# Patient Record
Sex: Male | Born: 1980 | Race: White | Hispanic: No | Marital: Married | Smoking: Former smoker
Health system: Southern US, Community
[De-identification: ages and names within clinical notes are randomized; demographics above are authoritative.]

## PROBLEM LIST (undated history)

## (undated) DIAGNOSIS — I10 Essential (primary) hypertension: Secondary | ICD-10-CM

## (undated) DIAGNOSIS — R1310 Dysphagia, unspecified: Secondary | ICD-10-CM

## (undated) DIAGNOSIS — K219 Gastro-esophageal reflux disease without esophagitis: Secondary | ICD-10-CM

## (undated) DIAGNOSIS — G43909 Migraine, unspecified, not intractable, without status migrainosus: Secondary | ICD-10-CM

## (undated) DIAGNOSIS — K224 Dyskinesia of esophagus: Secondary | ICD-10-CM

## (undated) HISTORY — DX: Migraine, unspecified, not intractable, without status migrainosus: G43.909

## (undated) HISTORY — DX: Dysphagia, unspecified: R13.10

## (undated) HISTORY — PX: NO PAST SURGERIES: SHX2092

## (undated) HISTORY — DX: Dyskinesia of esophagus: K22.4

## (undated) HISTORY — PX: WISDOM TOOTH EXTRACTION: SHX21

## (undated) HISTORY — DX: Gastro-esophageal reflux disease without esophagitis: K21.9

---

## 2008-07-29 ENCOUNTER — Emergency Department (HOSPITAL_COMMUNITY): Admission: EM | Admit: 2008-07-29 | Discharge: 2008-07-29 | Payer: Self-pay | Admitting: Emergency Medicine

## 2013-01-09 ENCOUNTER — Emergency Department (HOSPITAL_COMMUNITY): Payer: No Typology Code available for payment source

## 2013-01-09 ENCOUNTER — Encounter (HOSPITAL_COMMUNITY): Payer: Self-pay | Admitting: Emergency Medicine

## 2013-01-09 ENCOUNTER — Emergency Department (HOSPITAL_COMMUNITY)
Admission: EM | Admit: 2013-01-09 | Discharge: 2013-01-09 | Disposition: A | Discharge status: Home / Self Care | Payer: No Typology Code available for payment source | Attending: Emergency Medicine | Admitting: Emergency Medicine

## 2013-01-09 DIAGNOSIS — R079 Chest pain, unspecified: Secondary | ICD-10-CM

## 2013-01-09 DIAGNOSIS — F17200 Nicotine dependence, unspecified, uncomplicated: Secondary | ICD-10-CM | POA: Insufficient documentation

## 2013-01-09 DIAGNOSIS — R0789 Other chest pain: Secondary | ICD-10-CM | POA: Insufficient documentation

## 2013-01-09 DIAGNOSIS — I10 Essential (primary) hypertension: Secondary | ICD-10-CM | POA: Insufficient documentation

## 2013-01-09 DIAGNOSIS — M79609 Pain in unspecified limb: Secondary | ICD-10-CM | POA: Insufficient documentation

## 2013-01-09 DIAGNOSIS — R002 Palpitations: Secondary | ICD-10-CM | POA: Insufficient documentation

## 2013-01-09 HISTORY — DX: Essential (primary) hypertension: I10

## 2013-01-09 LAB — CBC WITH DIFFERENTIAL/PLATELET
Basophils Absolute: 0 10*3/uL (ref 0.0–0.1)
Basophils Relative: 0 % (ref 0–1)
Eosinophils Absolute: 0.2 10*3/uL (ref 0.0–0.7)
Hemoglobin: 16 g/dL (ref 13.0–17.0)
Lymphocytes Relative: 22 % (ref 12–46)
MCHC: 34.6 g/dL (ref 30.0–36.0)
Monocytes Relative: 9 % (ref 3–12)
Neutrophils Relative %: 67 % (ref 43–77)
WBC: 12.3 10*3/uL — ABNORMAL HIGH (ref 4.0–10.5)

## 2013-01-09 LAB — BASIC METABOLIC PANEL
Chloride: 102 mEq/L (ref 96–112)
GFR calc Af Amer: >90 mL/min (ref >90)
GFR calc non Af Amer: >90 mL/min (ref >90)
Glucose, Bld: 102 mg/dL — ABNORMAL HIGH (ref 70–99)
Potassium: 3.7 mEq/L (ref 3.5–5.1)
Sodium: 140 mEq/L (ref 135–145)

## 2013-01-09 LAB — MAGNESIUM: Magnesium: 2.2 mg/dL (ref 1.5–2.5)

## 2013-01-09 LAB — POCT I-STAT TROPONIN I: Troponin i, poc: 0.01 ng/mL (ref 0.00–0.08)

## 2013-01-09 MED ORDER — ASPIRIN 81 MG PO CHEW
324.0000 mg | CHEWABLE_TABLET | Freq: Once | ORAL | Status: AC
  Administered 2013-01-09: 324 mg via ORAL
  Filled 2013-01-09: qty 4

## 2013-01-09 MED ORDER — SODIUM CHLORIDE 0.9 % IV BOLUS (SEPSIS)
1000.0000 mL | Freq: Once | INTRAVENOUS | Status: AC
  Administered 2013-01-09: 1000 mL via INTRAVENOUS

## 2013-01-09 MED ORDER — MORPHINE SULFATE 4 MG/ML IJ SOLN
4.0000 mg | Freq: Once | INTRAMUSCULAR | Status: AC
  Administered 2013-01-09: 4 mg via INTRAVENOUS
  Filled 2013-01-09: qty 1

## 2013-01-09 MED ORDER — ONDANSETRON HCL 4 MG/2ML IJ SOLN
4.0000 mg | Freq: Once | INTRAMUSCULAR | Status: AC
  Administered 2013-01-09: 4 mg via INTRAVENOUS
  Filled 2013-01-09: qty 2

## 2013-01-09 NOTE — ED Provider Notes (Signed)
CSN: [id number]     Arrival date & time 01/09/13  1504 History   First MD Initiated Contact with Patient 01/09/13 1512     Chief Complaint  Patient presents with  . Tachycardia   (Consider location/radiation/quality/duration/timing/severity/associated sxs/prior Treatment) HPI Pt here with chest pain. He has never had pain like this before. Onset was this morning upon waking.  The pain is moderate in intensity. Described as heaviness and pressure with radiation to left arm. Modifying factors: worse with deep breaths.  Associated symptoms: palpitations, mild SOB, lightheadedness, no fever, no emesis.  Recent medical care: none.   Past Medical History  Diagnosis Date  . Hypertension    History reviewed. No pertinent past surgical history. History reviewed. No pertinent family history. History  Substance Use Topics  . Smoking status: Current Every Day Smoker    Types: Cigarettes  . Smokeless tobacco: Not on file  . Alcohol Use: No    Review of Systems Constitutional: Negative for fever.  Eyes: Negative for vision loss.  ENT: Negative for difficulty swallowing.  Cardiovascular: Positive for chest pain. Respiratory: Negative for respiratory distress.  Gastrointestinal:  Negative for vomiting.  Genitourinary: Negative for inability to void.  Musculoskeletal: Negative for gait problem.  Integumentary: Negative for rash.  Neurological: Negative for new focal weakness.     Allergies  Review of patient's allergies indicates no known allergies.  Home Medications  No current outpatient prescriptions on file. BP 121/95  Pulse 90  Temp(Src) 98.6 F (37 C) (Oral)  Resp 18  SpO2 98% Physical Exam Nursing note and vitals reviewed.  Constitutional: Pt is alert and appears stated age. Eyes: No injection, no scleral icterus. HENT: Atraumatic, airway open without erythema or exudate.  Respiratory: No respiratory distress. Equal breathing bilaterally. Cardiovascular: Tachycardic  rate, regular rhythm. Extremities warm and well perfused.  Abdomen: Soft, non-tender. MSK: Extremities are atraumatic without deformity. Skin: No rash, no wounds.   Neuro: No motor nor sensory deficit. GCS 15.      ED Course  Procedures (including critical care time) Labs Review Labs Reviewed  CBC WITH DIFFERENTIAL - Abnormal; Notable for the following:    WBC 12.3 (*)    Neutro Abs 8.3 (*)    Monocytes Absolute 1.1 (*)    All other components within normal limits  BASIC METABOLIC PANEL - Abnormal; Notable for the following:    Glucose, Bld 102 (*)    All other components within normal limits  D-DIMER, QUANTITATIVE  MAGNESIUM  TROPONIN I  POCT I-STAT TROPONIN I   Imaging Review Dg Chest Portable 1 View  01/09/2013   CLINICAL DATA:  Tachycardia.  EXAM: PORTABLE CHEST - 1 VIEW  COMPARISON:  None.  FINDINGS: The heart size and mediastinal contours are within normal limits. Both lungs are clear. The visualized skeletal structures are unremarkable.  IMPRESSION: No active disease.   Electronically Signed   By: Amie Portland M.D.   On: 01/09/2013 15:42    EKG Interpretation   None       MDM   1. Chest pain   2. Palpitations    32 y.o. male w/ PMHx of tobacco use presents w/ chest pain, tachycardia. Nursing reports initial monitor HR 160-170. Initial EKG with rate 155, sinus. On my eval pt 120 sinus tachycardia on monitor. Normal mental status, no hypotension. Atypical story for ACS. Pt given ASA, troponin neg x2. Plan to f/u with cardiology. Low risk PE, d-dimer neg. Pt given IVF bolus, IV morphine, IV zofran with  resolution of tachycardia. Electrolytes unremarkable. CXR without infection. Plan for cardiology follow up. Counseling provided regarding diagnosis, treatment plan, follow up recommendations, and return precautions. Questions answered.       I independently viewed, interpreted, and used in my medical decision making all ordered lab and imaging tests. Medical  Decision Making discussed with ED attending Dr. Jodi Mourning.     Charm Barges, MD 01/09/13 2221

## 2013-01-09 NOTE — ED Notes (Signed)
Presents with chest heaviness and pressure that began this am worse when taking a deep breath pain described as heaviness and dull with radiation to left arm assocaited with SOB and dizziness. Monitor shows SVT HR 160-170. Pt is alert and oriented. BP 167/80. +smoker, no cardiac history.

## 2013-01-10 NOTE — ED Provider Notes (Signed)
Medical screening examination/treatment/procedure(s) were conducted as a shared visit with non-physician practitioner(s) or resident  and myself.  I personally evaluated the patient during the encounter and agree with the findings and plan unless otherwise indicated.    I have personally reviewed any xrays and/ or EKG's with the provider and I agree with interpretation.   EKG Interpretation    Date/Time:  Monday January 09 2013 15:18:57 EST Ventricular Rate:  109 PR Interval:  147 QRS Duration: 102 QT Interval:  357 QTC Calculation: 481 R Axis:   44 Text Interpretation:  Sinus tachycardia Borderline prolonged QT interval No acute findings           Constant chest pressure since this am with palpitations, no syncope, mild radiation left arm.  No cardiac or lung hx.  Patient denies blood clot history, active cancer, recent major trauma or surgery, unilateral leg swelling/ pain, recent long travel, hemoptysis.  Tachy 170s initially, pt did bear down, possibly SVT.  No delta wave or short PR on EKG.   CP resolved.  Troponins neg, pt low risk PE and low risk CAD, close fup outpt discussed, comfortable with plan. Filed Vitals:   01/09/13 1601 01/09/13 1615 01/09/13 1700 01/09/13 1800  BP: 112/74 124/69 123/72 121/95  Pulse: 102 88 88 90  Temp:      TempSrc:      Resp:  14 15 18   SpO2: 100% 93% 94% 98%   Chest pain, Sinus tachycardia  Enid Skeens, MD 01/10/13 [phone]

## 2013-01-17 ENCOUNTER — Encounter: Payer: Self-pay | Admitting: Cardiology

## 2013-01-17 ENCOUNTER — Ambulatory Visit (INDEPENDENT_AMBULATORY_CARE_PROVIDER_SITE_OTHER): Payer: No Typology Code available for payment source | Admitting: Cardiology

## 2013-01-17 VITALS — BP 131/92 | HR 70 | Ht 68.0 in | Wt 214.0 lb

## 2013-01-17 DIAGNOSIS — R079 Chest pain, unspecified: Secondary | ICD-10-CM | POA: Insufficient documentation

## 2013-01-17 DIAGNOSIS — R002 Palpitations: Secondary | ICD-10-CM

## 2013-01-17 NOTE — Progress Notes (Signed)
Patient ID: Zaion Hreha, male   DOB: [birth year], 32 y.o.   MRN: [id number]     Patient Name: Camille Thau Date of Encounter: 01/17/2013  Primary Care Provider:  No PCP Per Patient Primary Cardiologist:  Tobias Alexander, H  Problem List   Past Medical History  Diagnosis Date  . Hypertension    No past surgical history on file.  Allergies  No Known Allergies  HPI  32 year old male with h/o hypertension who presented to the ER on 01/09/13 after he woke up with chest pain. The patient described his pain as pressure like associated with SOB. It lasted for several hours and resolved in the ER. The pain is moderate in intensity. Described as heaviness and pressure with radiation to left arm. Modifying factors: worse with deep breaths. Associated symptoms: palpitations, mild SOB, lightheadedness, no fever, no emesis.  He is fairly active, and doesn't feel limited in his activities by any symtoms. No more episodes since than. The patient lives quite stressful life, working, studying and taking care of 2 small kids, but didn't feel stressed that day. He states that he has h/o panic attacks but this felt different.  Home Medications  Prior to Admission medications   Not on File    Family History  No family history on file.  Social History  History   Social History  . Marital Status: Married    Spouse Name: N/A    Number of Children: N/A  . Years of Education: N/A   Occupational History  . Not on file.   Social History Main Topics  . Smoking status: Current Every Day Smoker    Types: Cigarettes  . Smokeless tobacco: Not on file  . Alcohol Use: No  . Drug Use: No  . Sexual Activity: Not on file   Other Topics Concern  . Not on file   Social History Narrative  . No narrative on file     Review of Systems, as per HPI, otherwise negative General:  No chills, fever, night sweats or weight changes.  Cardiovascular:  No chest pain, dyspnea on exertion, edema,  orthopnea, palpitations, paroxysmal nocturnal dyspnea. Dermatological: No rash, lesions/masses Respiratory: No cough, dyspnea Urologic: No hematuria, dysuria Abdominal:   No nausea, vomiting, diarrhea, bright red blood per rectum, melena, or hematemesis Neurologic:  No visual changes, wkns, changes in mental status. All other systems reviewed and are otherwise negative except as noted above.  Physical Exam  Blood pressure 131/92, pulse 70, height 5\' 8"  (1.727 m), weight 214 lb (97.07 kg).  General: Pleasant, NAD Psych: Normal affect. Neuro: Alert and oriented X 3. Moves all extremities spontaneously. HEENT: Normal  Neck: Supple without bruits or JVD. Lungs:  Resp regular and unlabored, CTA. Heart: RRR no s3, s4, or murmurs. Abdomen: Soft, non-tender, non-distended, BS + x 4.  Extremities: No clubbing, cyanosis or edema. DP/PT/Radials 2+ and equal bilaterally.  Labs:  No results found for this basename: CKTOTAL, CKMB, TROPONINI,  in the last 72 hours Lab Results  Component Value Date   WBC 12.3* 01/09/2013   HGB 16.0 01/09/2013   HCT 46.2 01/09/2013   MCV 85.6 01/09/2013   PLT 262 01/09/2013   No results found for this basename: NA, K, CL, CO2, BUN, CREATININE, CALCIUM, LABALBU, PROT, BILITOT, ALKPHOS, ALT, AST, GLUCOSE,  in the last 168 hours No results found for this basename: CHOL, HDL, LDLCALC, TRIG   Lab Results  Component Value Date   DDIMER <0.27 01/09/2013   No  components found with this basename: POCBNP,   Accessory Clinical Findings  echocardiogram  ECG - sinus rhythm, 64 beats per minute, normal ECG   Assessment & Plan  32 year old male with no prior medical history who presented to the ER with atypical chest pain.   We will preform exercise treadmill stress test and order CMP, CBC and TSH. The pretest probability is low, but some of the symptoms such as SOB and radiation to the arm is concerning.   Follow up in 1 month.  Lars Masson, MD,  [facility] 01/17/2013, 12:22 PM

## 2013-01-17 NOTE — Patient Instructions (Signed)
Your physician has requested that you have an exercise tolerance test. For further information please visit [email or web address] Please also follow instruction sheet, as given.  Your physician recommends that you return for FASTING lab work. (lipid,TSH,CMP)  Your physician recommends that you schedule a follow-up appointment after exercise test

## 2013-01-23 ENCOUNTER — Other Ambulatory Visit: Payer: No Typology Code available for payment source

## 2013-02-24 ENCOUNTER — Encounter: Payer: No Typology Code available for payment source | Admitting: Physician Assistant

## 2013-02-28 ENCOUNTER — Encounter: Payer: Self-pay | Admitting: Physician Assistant

## 2014-05-02 ENCOUNTER — Ambulatory Visit: Payer: No Typology Code available for payment source | Admitting: Family

## 2015-10-22 ENCOUNTER — Encounter (HOSPITAL_COMMUNITY): Payer: Self-pay | Admitting: Emergency Medicine

## 2015-10-22 ENCOUNTER — Emergency Department (HOSPITAL_COMMUNITY): Payer: No Typology Code available for payment source

## 2015-10-22 ENCOUNTER — Emergency Department (HOSPITAL_COMMUNITY)
Admission: EM | Admit: 2015-10-22 | Discharge: 2015-10-22 | Disposition: A | Discharge status: Home / Self Care | Payer: No Typology Code available for payment source | Attending: Emergency Medicine | Admitting: Emergency Medicine

## 2015-10-22 DIAGNOSIS — R51 Headache: Secondary | ICD-10-CM | POA: Diagnosis present

## 2015-10-22 DIAGNOSIS — F17210 Nicotine dependence, cigarettes, uncomplicated: Secondary | ICD-10-CM | POA: Diagnosis not present

## 2015-10-22 DIAGNOSIS — R791 Abnormal coagulation profile: Secondary | ICD-10-CM | POA: Insufficient documentation

## 2015-10-22 DIAGNOSIS — R519 Headache, unspecified: Secondary | ICD-10-CM

## 2015-10-22 DIAGNOSIS — I10 Essential (primary) hypertension: Secondary | ICD-10-CM | POA: Insufficient documentation

## 2015-10-22 LAB — COMPREHENSIVE METABOLIC PANEL WITH GFR
ALT: 39 U/L (ref 17–63)
AST: 35 U/L (ref 15–41)
Albumin: 4.4 g/dL (ref 3.5–5.0)
Alkaline Phosphatase: 88 U/L (ref 38–126)
Anion gap: 10 (ref 5–15)
BUN: 8 mg/dL (ref 6–20)
CO2: 24 mmol/L (ref 22–32)
Calcium: 9.5 mg/dL (ref 8.9–10.3)
Chloride: 104 mmol/L (ref 101–111)
Creatinine, Ser: 1.2 mg/dL (ref 0.61–1.24)
GFR calc Af Amer: >60 mL/min
GFR calc non Af Amer: >60 mL/min
Glucose, Bld: 98 mg/dL (ref 65–99)
Potassium: 3.4 mmol/L — ABNORMAL LOW (ref 3.5–5.1)
Sodium: 138 mmol/L (ref 135–145)
Total Bilirubin: 0.4 mg/dL (ref 0.3–1.2)
Total Protein: 7.4 g/dL (ref 6.5–8.1)

## 2015-10-22 LAB — CBC
HCT: 46.8 % (ref 39.0–52.0)
Hemoglobin: 15.4 g/dL (ref 13.0–17.0)
MCH: 28.2 pg (ref 26.0–34.0)
MCHC: 32.9 g/dL (ref 30.0–36.0)
MCV: 85.6 fL (ref 78.0–100.0)
Platelets: 233 K/uL (ref 150–400)
RBC: 5.47 MIL/uL (ref 4.22–5.81)
RDW: 12.6 % (ref 11.5–15.5)
WBC: 9.3 K/uL (ref 4.0–10.5)

## 2015-10-22 LAB — PROTIME-INR
INR: 0.91
Prothrombin Time: 12.3 s (ref 11.4–15.2)

## 2015-10-22 LAB — I-STAT CHEM 8, ED
BUN: 11 mg/dL (ref 6–20)
CREATININE: 1.1 mg/dL (ref 0.61–1.24)
Calcium, Ion: 1.17 mmol/L (ref 1.15–1.40)
Chloride: 101 mmol/L (ref 101–111)
Glucose, Bld: 94 mg/dL (ref 65–99)
HEMATOCRIT: 50 % (ref 39.0–52.0)
Hemoglobin: 17 g/dL (ref 13.0–17.0)
POTASSIUM: 3.3 mmol/L — AB (ref 3.5–5.1)
Sodium: 141 mmol/L (ref 135–145)
TCO2: 24 mmol/L (ref 0–100)

## 2015-10-22 LAB — DIFFERENTIAL
Basophils Absolute: 0 K/uL (ref 0.0–0.1)
Basophils Relative: 0 %
Eosinophils Absolute: 0.2 K/uL (ref 0.0–0.7)
Eosinophils Relative: 2 %
Lymphocytes Relative: 31 %
Lymphs Abs: 2.9 K/uL (ref 0.7–4.0)
Monocytes Absolute: 0.6 K/uL (ref 0.1–1.0)
Monocytes Relative: 7 %
Neutro Abs: 5.5 K/uL (ref 1.7–7.7)
Neutrophils Relative %: 60 %

## 2015-10-22 LAB — APTT: aPTT: 29 s (ref 24–36)

## 2015-10-22 LAB — I-STAT TROPONIN, ED: Troponin i, poc: 0 ng/mL (ref 0.00–0.08)

## 2015-10-22 NOTE — ED Triage Notes (Addendum)
Pt from home with c/o left sided sharp headache starting last night while he was in the grocery store.  Reports that he was able to read words but unable to get the words out or communicate at that time.  He additionally reports severe tingling/numbness in his right hand.  Pt states he's had migraines in the past but the pain was worse than he's ever had.  Pt states he went home, took medication and went to sleep.  Pt reports eadache is now dull, numbness resolved, tingling still present, and able to communicate at his baseline.  Pt in NAD, A&O.

## 2015-10-22 NOTE — ED Notes (Signed)
Patient has returned from being out of the department; placed patient on monitor, continuous pulse oximetry and blood pressure cuff; visitor at bedside

## 2015-10-22 NOTE — Discharge Instructions (Signed)
As we discussed your lab work, CT, and MRI were normal. Recommend that you follow-up with neurology-- call their office to make appointment. Return here for any new or worsening symptoms including severe headache, dizziness, confusion, numbness, or weakness.

## 2015-10-22 NOTE — ED Notes (Signed)
Patient being transported out of the department at this time for testing by Radiology transport tech

## 2015-10-22 NOTE — ED Notes (Signed)
Headache that started last night about 600pm.  Patient states he had trouble speaking with same, but resolved last night.   Patient states he had that previously, but "It was a complicated migraine".   No Code Stroke called at this time.

## 2015-10-22 NOTE — ED Provider Notes (Signed)
[facility]-EMERGENCY DEPT Provider Note   CSN: [id number] Arrival date & time: 10/22/15  4098     History   Chief Complaint No chief complaint on file.   HPI Stephen Ayala is a 35 y.o. male.  The history is provided by the patient and medical records.    35 year old male with history of hypertension, not currently on medication, presenting to the ED for headache. Patient states this began yesterday while he was at the grocery store with his children.  States it was intense, sharp pain behind his left eye that gradually got worse. He states he completed his shopping as normal.  He was looking at a magazine in the checkout line and noticed that when he was reading the words he was transposing them and was having some trouble recognizing pictures on the pages. He states he went to read out loud and noticed that he could not "get his words out". No blurred vision, visual loss, focal eye pain, photophobia, nausea, emesis, tinnitus, confusion, or gait disturbance.  He states this improved but then he began having some numbness of his right hand. He states he was able to check out at the store and drive home. He states he took some Tylenol without relief so he then took a Goody powder and was able to go to sleep is normal. States upon waking this morning he felt pretty much back to baseline. He states he has been able to talk fluently today. States numbness in right hand has resolved.  States he does have history of migraines and has had similar episodes in the past. He states the only reason he is here is because he called his mother and she was very concerned and encouraged him to be seen. He has no known history of TIA or stroke. States his grandmother has had strokes in the past. He is not currently on any type of anticoagulation.  He is not currently established with a neurologist. Patient does admit to some stress recently as he and his wife just had their third daughter. He states their home has been  "chaotic" lately. States he does have a mild headache at present, rated 2/10 localized to his right occipital region.  No fever or chills. No neck pain.  Past Medical History:  Diagnosis Date  . Hypertension     Patient Active Problem List   Diagnosis Date Noted  . Chest pain at rest 01/17/2013    No past surgical history on file.     Home Medications    Prior to Admission medications   Not on File    Family History No family history on file.  Social History Social History  Substance Use Topics  . Smoking status: Current Every Day Smoker    Types: Cigarettes  . Smokeless tobacco: Not on file  . Alcohol use No     Allergies   Review of patient's allergies indicates no known allergies.   Review of Systems Review of Systems  Neurological: Positive for headaches.  All other systems reviewed and are negative.    Physical Exam Updated Vital Signs BP 156/97   Pulse (!) 128   SpO2 98%   Physical Exam  Constitutional: He is oriented to person, place, and time. He appears well-developed and well-nourished. No distress.  Sitting up in bed, able to text on cell phone, NAD  HENT:  Head: Normocephalic and atraumatic.  Right Ear: External ear normal.  Left Ear: External ear normal.  Mouth/Throat: Oropharynx is clear and  moist.  Eyes: Conjunctivae and EOM are normal. Pupils are equal, round, and reactive to light.  Pupils symmetric and reactive bilaterally, EOMs fully intact, no nystagmus, no field cuts, normal confrontation  Neck: Normal range of motion and full passive range of motion without pain. Neck supple. No neck rigidity.  No rigidity, no meningismus  Cardiovascular: Normal rate, regular rhythm and normal heart sounds.   No murmur heard. Pulmonary/Chest: Effort normal and breath sounds normal. No respiratory distress. He has no wheezes. He has no rhonchi.  Abdominal: Soft. Bowel sounds are normal. There is no tenderness. There is no guarding.    Musculoskeletal: Normal range of motion. He exhibits no edema.  Neurological: He is alert and oriented to person, place, and time. He has normal strength. He displays no tremor. No cranial nerve deficit or sensory deficit. He displays no seizure activity.  AAOx3, answering questions and following commands appropriately; equal strength UE and LE bilaterally; CN grossly intact; moves all extremities appropriately without ataxia; no dysmetria with finger to nose; normal sensation to sharp and dull touch in all 4 extremities as well as face and trunk; normal, fluid speech without noted expressive aphasia or slurring; normal gait  Skin: Skin is warm and dry. No rash noted. He is not diaphoretic.  Psychiatric: He has a normal mood and affect. His behavior is normal. Thought content normal.  Nursing note and vitals reviewed.    ED Treatments / Results  Labs (all labs ordered are listed, but only abnormal results are displayed) Labs Reviewed  COMPREHENSIVE METABOLIC PANEL - Abnormal; Notable for the following:       Result Value   Potassium 3.4 (*)    All other components within normal limits  I-STAT CHEM 8, ED - Abnormal; Notable for the following:    Potassium 3.3 (*)    All other components within normal limits  PROTIME-INR  APTT  CBC  DIFFERENTIAL  I-STAT TROPOININ, ED    EKG  EKG Interpretation None       Radiology Ct Head Wo Contrast  Result Date: 10/22/2015 CLINICAL DATA:  Right arm numbness, headache. EXAM: CT HEAD WITHOUT CONTRAST TECHNIQUE: Contiguous axial images were obtained from the base of the skull through the vertex without intravenous contrast. COMPARISON:  None. FINDINGS: Brain: No evidence of acute infarction, hemorrhage, hydrocephalus, extra-axial collection or mass lesion/mass effect. Vascular: No hyperdense vessel or unexpected calcification. Skull: No osseous abnormality. Sinuses/Orbits: Visualized paranasal sinuses are clear. Visualized mastoid sinuses are  clear. Visualized orbits demonstrate no focal abnormality. Other: None IMPRESSION: No acute intracranial pathology. Electronically Signed   By: Elige Ko   On: 10/22/2015 10:51   Mr Brain Wo Contrast  Result Date: 10/22/2015 CLINICAL DATA:  Headache. Expressive aphasia and right hand numbness -resolved EXAM: MRI HEAD WITHOUT CONTRAST TECHNIQUE: Multiplanar, multiecho pulse sequences of the brain and surrounding structures were obtained without intravenous contrast. COMPARISON:  CT head 10/22/2015 FINDINGS: Ventricle size normal.  Cerebral volume normal. Negative for acute or chronic infarction Negative for demyelinating disease. Cerebral white matter normal. Basal ganglia and brainstem and cerebellum normal. Negative for intracranial hemorrhage. Negative for mass or edema. No shift of the midline structures Circle of Willis patent. Left vertebral artery appears to end in PICA Minimal mucosal edema paranasal sinuses. Normal orbital structures. Pituitary normal in size. IMPRESSION: Normal unenhanced MRI of the brain. Electronically Signed   By: Marlan Palau M.D.   On: 10/22/2015 13:44    Procedures Procedures (including critical care time)  Medications  Ordered in ED Medications - No data to display   Initial Impression / Assessment and Plan / ED Course  I have reviewed the triage vital signs and the nursing notes.  Pertinent labs & imaging results that were available during my care of the patient were reviewed by me and considered in my medical decision making (see chart for details).  Clinical Course   35 year old male here with headache and some episodes of expressive aphasia and right hand numbness which occurred yesterday. Does have mostly resolved at this time, reports a dull headache is right occipital area. Declined medications here as pain was so minor. He is awake, alert, appropriately oriented. His neurologic exam is benign. He has no noted deficits. He is ambulatory with a steady  gait. Labwork is reassuring. CT head is also negative. Symptoms somewhat atypical for migraine, therefore MRI was obtained which is negative for acute findings. Favor complicated migraine at this time. No significant RF for TIA or stroke.  No meningeal signs.  Symptoms and presentation [facility].  Feel patient is stable for discharge as he remains at his neurologic baseline. He was given neurology follow-up.  Discussed plan with patient, he acknowledged understanding and agreed with plan of care.  Return precautions given for new or worsening symptoms.  Case discussed with attending physician, Dr. Criss AlvineGoldston, who agrees with assessment and plan of care.  Final Clinical Impressions(s) / ED Diagnoses   Final diagnoses:  Headache, unspecified headache type    New Prescriptions Discharge Medication List as of 10/22/2015  1:56 PM       Garlon HatchetLisa M Marisal Swarey, PA-C 10/22/15 1621    Pricilla LovelessScott Goldston, MD 10/23/15 1515

## 2015-11-01 ENCOUNTER — Encounter: Payer: Self-pay | Admitting: Neurology

## 2015-11-01 ENCOUNTER — Ambulatory Visit (INDEPENDENT_AMBULATORY_CARE_PROVIDER_SITE_OTHER): Payer: No Typology Code available for payment source | Admitting: Neurology

## 2015-11-01 VITALS — BP 142/85 | HR 78 | Ht 68.0 in | Wt 214.6 lb

## 2015-11-01 DIAGNOSIS — G43009 Migraine without aura, not intractable, without status migrainosus: Secondary | ICD-10-CM | POA: Diagnosis not present

## 2015-11-01 MED ORDER — SUMATRIPTAN SUCCINATE 100 MG PO TABS: 100.0000 mg | ORAL_TABLET | Freq: Once | ORAL | 12 refills | Status: DC

## 2015-11-01 MED ORDER — PROPRANOLOL HCL ER 60 MG PO CP24: 60.0000 mg | ORAL_CAPSULE | Freq: Every day | ORAL | 12 refills | Status: DC

## 2015-11-01 NOTE — Progress Notes (Signed)
[facility]    Provider:  Dr Lucia Gaskins Referring Provider: No ref. provider found Primary Care Physician:  Ezequiel Kayser, MD  CC:  headache  HPI:  Stephen Ayala is a 36 y.o. male here as a referral from the emergency room for migraines. Patient has a history of migraines, hypertension treated, esophageal spasms, obesity, low back pain, tobacco use, chest pain. .he has a history of migraines and he has had confusion, word-finding difficulty and similar episode as seem in the ED. Migraines started in teenage years. Has an extensive history of migraines in the family with mother and grandmother. Have waxed and waned over the years. Recent stress as he is having his third child and migraines worsening in frequency and severity. Migraines a few times a month. He has headaches a few times a week. Difficult to tel the difference. Last week was the worse migraine ever had, was in the grocery store, pain startes behind the left eye, sharp, within a few minutes numbness in the right hand. He could read but had dificulty talking, slight confusion. Headache was severe 10/10 in pain on average the headaches are 3-4/10. Headaches often last a few hours but this last one lasted 2 hours and he had to go to sleep and the next morning had some mild residual pain. He gets nausea but no vomiting. Tylenol and Goody's helped. No blurry vision with the headaches or hearing changes. OTC use 1-2x a week.   He has some baseline tingling in the right hand worse in the morning.   Reviewed notes, labs and imaging from outside physicians, which showed:  Patient was seen in the emergency room on 10/22/2015. He is a 35 year old with a history of hypertension on medication. He presented to the ED for headaches. It happened while he was in the grocery store. It was intense, sharp pain behind his left eye that gradually got worse. He could not get his words out.He stated he was able to check out at the store and drive  home. He took some Tylenol without relief so he then took a Goody powder and was able to go to sleep is normal. Upon waking he felt pretty much back to baseline. He has been able to talk fluently since. Numbness in right hand also which has resolved.  He has history of migraines and has had similar episodes in the past. The only reason he went to ED is because he called his mother and she was very concerned and encouraged him to be seen. He has no known history of TIA or stroke. Grandmother has had strokes in the past. He is not on any type of anticoagulation.  No neurologist. Patient does admit to some stress as he and his wife just had their third daughter. Home has been "chaotic" lately. No fever or chills. No neck pain. Neurologic exam in the emergency room was normal. No deficits noted. MRI was obtained which was negative. Likely migraine related. Patient was discharged home.  Personally reviewed images of MRI of the brain images and agree with the following:  Ventricle size normal.  Cerebral volume normal.  Negative for acute or chronic infarction  Negative for demyelinating disease. Cerebral white matter normal. Basal ganglia and brainstem and cerebellum normal.  Negative for intracranial hemorrhage. Negative for mass or edema. No shift of the midline structures  Circle of Willis patent. Left vertebral artery appears to end in PICA  Minimal mucosal edema paranasal sinuses. Normal orbital structures. Pituitary normal in size.  IMPRESSION: Normal unenhanced MRI of the brain.  Creatinine 1.1 10/22/2015, TSH 0.8 05/13/2015.  Review of Systems: Patient complains of symptoms per HPI as well as the following symptoms: Fatigue, headache, numbness, weakness, snoring, ringing in ears, joint pain, decreased energy. Pertinent negatives per HPI. All others negative.   Social History   Social History  . Marital status: Married    Spouse name: Merla RichesMary Grace  . Number of children: 3  .  Years of education: MBA   Occupational History  . [employer]    Social History Main Topics  . Smoking status: Current Every Day Smoker    Packs/day: 1.00    Types: Cigarettes  . Smokeless tobacco: Never Used  . Alcohol use No  . Drug use: No  . Sexual activity: Not on file   Other Topics Concern  . Not on file   Social History Narrative   Lives  With wife, Merla RichesMary Grace and children   Caffeine use:  Soda- several per day   Sweet tea daily    Family History  Problem Relation Age of Onset  . Migraines Mother   . Cancer Father   . Colon cancer Maternal Grandmother   . Breast cancer Maternal Grandmother   . Stroke Maternal Grandmother     Past Medical History:  Diagnosis Date  . Esophageal spasm   . Hypertension   . Migraine     Past Surgical History:  Procedure Laterality Date  . NO PAST SURGERIES      Current Outpatient Prescriptions  Medication Sig Dispense Refill  . acetaminophen (TYLENOL) 500 MG tablet Take 1,000 mg by mouth every 6 (six) hours as needed for moderate pain.    Marland Kitchen. propranolol ER (INDERAL LA) 60 MG 24 hr capsule Take 1 capsule (60 mg total) by mouth daily. 30 capsule 12  . SUMAtriptan (IMITREX) 100 MG tablet Take 1 tablet (100 mg total) by mouth once. May repeat in 2 hours if headache persists or recurs. 10 tablet 12   No current facility-administered medications for this visit.     Allergies as of 11/01/2015  . (No Known Allergies)    Vitals: BP (!) 142/85 (BP Location: Right Arm, Patient Position: Sitting, Cuff Size: Normal)   Pulse 78   Ht 5\' 8"  (1.727 m)   Wt 214 lb 9.6 oz (97.3 kg)   BMI 32.63 kg/m  Last Weight:  Wt Readings from Last 1 Encounters:  11/01/15 214 lb 9.6 oz (97.3 kg)   Last Height:   Ht Readings from Last 1 Encounters:  11/01/15 5\' 8"  (1.727 m)    Physical exam: Exam: Gen: NAD, conversant, well nourised, obese, well groomed                     CV: RRR, no MRG. No Carotid Bruits. No peripheral edema, warm,  nontender Eyes: Conjunctivae clear without exudates or hemorrhage  Neuro: Detailed Neurologic Exam  Speech:    Speech is normal; fluent and spontaneous with normal comprehension.  Cognition:    The patient is oriented to person, place, and time;     recent and remote memory intact;     language fluent;     normal attention, concentration,     fund of knowledge Cranial Nerves:    The pupils are equal, round, and reactive to light. The fundi are normal and spontaneous venous pulsations are present. Visual fields are full to finger confrontation. Extraocular movements are intact. Trigeminal sensation is intact and the muscles  of mastication are normal. The face is symmetric. The palate elevates in the midline. Hearing intact. Voice is normal. Shoulder shrug is normal. The tongue has normal motion without fasciculations.   Coordination:    Normal finger to nose and heel to shin. Normal rapid alternating movements.   Gait:    Heel-toe and tandem gait are normal.   Motor Observation:    No asymmetry, no atrophy, and no involuntary movements noted. Tone:    Normal muscle tone.    Posture:    Posture is normal. normal erect    Strength:    Strength is V/V in the upper and lower limbs.      Sensation: intact to LT     Reflex Exam:  DTR's:    Deep tendon reflexes in the upper and lower extremities are normal bilaterally.   Toes:    The toes are downgoing bilaterally.   Clonus:    Clonus is absent.      Assessment/Plan:  35 year old with migraines  As far as your medications are concerned, I would like to suggest: Propranolol in the evenings before bed, may also help with blood pressure At onset of headache take Imitrex. Please take one tablet at the onset of your headache. If it does not improve the symptoms please take one additional tablet. Do not take more then 2 tablets in 24hrs. Do not take use more then 2 to 3 days in a week. Discussed side effects as per patien  tinstructions  I would like to see you back in 3 months, sooner if we need to. Please call us with any interim questions, concerns, problems, updates or refill requests.   Discussed: To prevent or relieve headaches, try the following: Cool Compress. Lie down and place a cool compress on your head.  Avoid headache triggers. If certain foods or odors seem to have triggered your migraines in the past, avoid them. A headache diary might help you identify triggers.  Include physical activity in your daily routine. Try a daily walk or other moderate aerobic exercise.  Manage stress. Find healthy ways to cope with the stressors, such as delegating tasks on your to-do list.  Practice relaxation techniques. Try deep breathing, yoga, massage and visualization.  Eat regularly. Eating regularly scheduled meals and maintaining a healthy diet might help prevent headaches. Also, drink plenty of fluids.  Follow a regular sleep schedule. Sleep deprivation might contribute to headaches Consider biofeedback. With this mind-body technique, you learn to control certain bodily functions - such as muscle tension, heart rate and blood pressure - to prevent headaches or reduce headache pain.    Proceed to emergency room if you experience new or worsening symptoms or symptoms do not resolve, if you have new neurologic symptoms or if headache is severe, or for any concerning symptom.   Naomie Dean, MD  [facility] [address] [place], [place] [address]  Phone [phone] Fax [phone]

## 2015-11-01 NOTE — Patient Instructions (Signed)
Remember to drink plenty of fluid, eat healthy meals and do not skip any meals. Try to eat protein with a every meal and eat a healthy snack such as fruit or nuts in between meals. Try to keep a regular sleep-wake schedule and try to exercise daily, particularly in the form of walking, 20-30 minutes a day, if you can.   As far as your medications are concerned, I would like to suggest: Propranolol in the evenings before bed At onset of headache take Imitrex. Please take one tablet at the onset of your headache. If it does not improve the symptoms please take one additional tablet. Do not take more then 2 tablets in 24hrs. Do not take use more then 2 to 3 days in a week.  I would like to see you back in 3 months, sooner if we need to. Please call us with any interim questions, concerns, problems, updates or refill requests.   Our phone number is [phone]. We also have an after hours call service for urgent matters and there is a physician on-call for urgent questions. For any emergencies you know to call 911 or go to the nearest emergency room Sumatriptan tablets What is this medicine? SUMATRIPTAN (soo ma TRIP tan) is used to treat migraines with or without aura. An aura is a strange feeling or visual disturbance that warns you of an attack. It is not used to prevent migraines. This medicine may be used for other purposes; ask your health care provider or pharmacist if you have questions. What should I tell my health care provider before I take this medicine? They need to know if you have any of these conditions: -circulation problems in fingers and toes -diabetes -heart disease -high blood pressure -high cholesterol -history of irregular heartbeat -history of stroke -kidney disease -liver disease -postmenopausal or surgical removal of uterus and ovaries -seizures -smoke tobacco -stomach or intestine problems -an unusual or allergic reaction to sumatriptan, other medicines, foods,  dyes, or preservatives -pregnant or trying to get pregnant -breast-feeding How should I use this medicine? Take this medicine by mouth with a glass of water. Follow the directions on the prescription label. This medicine is taken at the first symptoms of a migraine. It is not for everyday use. If your migraine headache returns after one dose, you can take another dose as directed. You must leave at least 2 hours between doses, and do not take more than 100 mg as a single dose. Do not take more than 200 mg total in any 24 hour period. If there is no improvement at all after the first dose, do not take a second dose without talking to your doctor or health care professional. Do not take your medicine more often than directed. Talk to your pediatrician regarding the use of this medicine in children. Special care may be needed. Overdosage: If you think you have taken too much of this medicine contact a poison control center or emergency room at once. NOTE: This medicine is only for you. Do not share this medicine with others. What if I miss a dose? This does not apply; this medicine is not for regular use. What may interact with this medicine? Do not take this medicine with any of the following medicines: -cocaine -ergot alkaloids like dihydroergotamine, ergonovine, ergotamine, methylergonovine -feverfew -MAOIs like Carbex, Eldepryl, Marplan, Nardil, and Parnate -other medicines for migraine headache like almotriptan, eletriptan, frovatriptan, naratriptan, rizatriptan, zolmitriptan -tryptophan This medicine may also interact with the following medications: -certain medicines  for depression, anxiety, or psychotic disturbances This list may not describe all possible interactions. Give your health care provider a list of all the medicines, herbs, non-prescription drugs, or dietary supplements you use. Also tell them if you smoke, drink alcohol, or use illegal drugs. Some items may interact with your  medicine. What should I watch for while using this medicine? Only take this medicine for a migraine headache. Take it if you get warning symptoms or at the start of a migraine attack. It is not for regular use to prevent migraine attacks. You may get drowsy or dizzy. Do not drive, use machinery, or do anything that needs mental alertness until you know how this medicine affects you. To reduce dizzy or fainting spells, do not sit or stand up quickly, especially if you are an older patient. Alcohol can increase drowsiness, dizziness and flushing. Avoid alcoholic drinks. Smoking cigarettes may increase the risk of heart-related side effects from using this medicine. If you take migraine medicines for 10 or more days a month, your migraines may get worse. Keep a diary of headache days and medicine use. Contact your healthcare professional if your migraine attacks occur more frequently. What side effects may I notice from receiving this medicine? Side effects that you should report to your doctor or health care professional as soon as possible: -allergic reactions like skin rash, itching or hives, swelling of the face, lips, or tongue -bloody or watery diarrhea -hallucination, loss of contact with reality -pain, tingling, numbness in the face, hands, or feet -seizures -signs and symptoms of a blood clot such as breathing problems; changes in vision; chest pain; severe, sudden headache; pain, swelling, warmth in the leg; trouble speaking; sudden numbness or weakness of the face, arm, or leg -signs and symptoms of a dangerous change in heartbeat or heart rhythm like chest pain; dizziness; fast or irregular heartbeat; palpitations, feeling faint or lightheaded; falls; breathing problems -signs and symptoms of a stroke like changes in vision; confusion; trouble speaking or understanding; severe headaches; sudden numbness or weakness of the face, arm, or leg; trouble walking; dizziness; loss of balance or  coordination -stomach pain Side effects that usually do not require medical attention (report these to your doctor or health care professional if they continue or are bothersome): -changes in taste -facial flushing -headache -muscle cramps -muscle pain -nausea, vomiting -weak or tired This list may not describe all possible side effects. Call your doctor for medical advice about side effects. You may report side effects to FDA at 1-800-FDA-1088. Where should I keep my medicine? Keep out of the reach of children. Store at room temperature between 2 and 30 degrees C (36 and 86 degrees F). Throw away any unused medicine after the expiration date. NOTE: This sheet is a summary. It may not cover all possible information. If you have questions about this medicine, talk to your doctor, pharmacist, or health care provider.    2016, Elsevier/Gold Standard. (2014-08-16 17:46:40)  Propranolol extended-release capsules What is this medicine? PROPRANOLOL (proe PRAN oh lole) is a beta-blocker. Beta-blockers reduce the workload on the heart and help it to beat more regularly. This medicine is used to treat high blood pressure, heart muscle disease, and prevent chest pain caused by angina. It is also used to prevent migraine headaches. You should not use this medicine to treat a migraine that has already started. This medicine may be used for other purposes; ask your health care provider or pharmacist if you have questions. What should I  tell my health care provider before I take this medicine? They need to know if you have any of these conditions: -circulation problems, or blood vessel disease -diabetes -history of heart attack or heart disease, vasospastic angina -kidney disease -liver disease -lung or breathing disease, like asthma or emphysema -pheochromocytoma -slow heart rate -thyroid disease -an unusual or allergic reaction to propranolol, other beta-blockers, medicines, foods, dyes, or  preservatives -pregnant or trying to get pregnant -breast-feeding How should I use this medicine? Take this medicine by mouth with a glass of water. Follow the directions on the prescription label. Do not crush or chew. Take your doses at regular intervals. Do not take your medicine more often than directed. Do not stop taking except on the advice of your doctor or health care professional. Talk to your pediatrician regarding the use of this medicine in children. Special care may be needed. Overdosage: If you think you have taken too much of this medicine contact a poison control center or emergency room at once. NOTE: This medicine is only for you. Do not share this medicine with others. What if I miss a dose? If you miss a dose, take it as soon as you can. If it is almost time for your next dose, take only that dose. Do not take double or extra doses. What may interact with this medicine? Do not take this medicine with any of the following medications: -feverfew -phenothiazines like chlorpromazine, mesoridazine, prochlorperazine, thioridazine This medicine may also interact with the following medications: -aluminum hydroxide gel -antipyrine -antiviral medicines for HIV or AIDS -barbiturates like phenobarbital -certain medicines for blood pressure, heart disease, irregular heart beat -cimetidine -ciprofloxacin -diazepam -fluconazole -haloperidol -isoniazid -medicines for cholesterol like cholestyramine or colestipol -medicines for mental depression -medicines for migraine headache like almotriptan, eletriptan, frovatriptan, naratriptan, rizatriptan, sumatriptan, zolmitriptan -NSAIDs, medicines for pain and inflammation, like ibuprofen or naproxen -phenytoin -rifampin -teniposide -theophylline -thyroid medicines -tolbutamide -warfarin -zileuton This list may not describe all possible interactions. Give your health care provider a list of all the medicines, herbs,  non-prescription drugs, or dietary supplements you use. Also tell them if you smoke, drink alcohol, or use illegal drugs. Some items may interact with your medicine. What should I watch for while using this medicine? Visit your doctor or health care professional for regular check ups. Contact your doctor right away if your symptoms worsen. Check your blood pressure and pulse rate regularly. Ask your health care professional what your blood pressure and pulse rate should be, and when you should contact them. Do not stop taking this medicine suddenly. This could lead to serious heart-related effects. You may get drowsy or dizzy. Do not drive, use machinery, or do anything that needs mental alertness until you know how this drug affects you. Do not stand or sit up quickly, especially if you are an older patient. This reduces the risk of dizzy or fainting spells. Alcohol can make you more drowsy and dizzy. Avoid alcoholic drinks. This medicine can affect blood sugar levels. If you have diabetes, check with your doctor or health care professional before you change your diet or the dose of your diabetic medicine. Do not treat yourself for coughs, colds, or pain while you are taking this medicine without asking your doctor or health care professional for advice. Some ingredients may increase your blood pressure. What side effects may I notice from receiving this medicine? Side effects that you should report to your doctor or health care professional as soon as possible: -allergic reactions  like skin rash, itching or hives, swelling of the face, lips, or tongue -breathing problems -changes in blood sugar -cold hands or feet -difficulty sleeping, nightmares -dry peeling skin -hallucinations -muscle cramps or weakness -slow heart rate -swelling of the legs and ankles -vomiting Side effects that usually do not require medical attention (report to your doctor or health care professional if they continue or  are bothersome): -change in sex drive or performance -diarrhea -dry sore eyes -hair loss -nausea -weak or tired This list may not describe all possible side effects. Call your doctor for medical advice about side effects. You may report side effects to FDA at 1-800-FDA-1088. Where should I keep my medicine? Keep out of the reach of children. Store at room temperature between 15 and 30 degrees C (59 and 86 degrees F). Protect from light, moisture and freezing. Keep container tightly closed. Throw away any unused medicine after the expiration date. NOTE: This sheet is a summary. It may not cover all possible information. If you have questions about this medicine, talk to your doctor, pharmacist, or health care provider.    2016, Elsevier/Gold Standard. (2012-10-14 14:58:56)

## 2015-11-03 DIAGNOSIS — G43009 Migraine without aura, not intractable, without status migrainosus: Secondary | ICD-10-CM | POA: Insufficient documentation

## 2018-04-15 ENCOUNTER — Other Ambulatory Visit: Payer: Self-pay | Admitting: Internal Medicine

## 2018-04-15 DIAGNOSIS — M5030 Other cervical disc degeneration, unspecified cervical region: Secondary | ICD-10-CM

## 2018-05-09 ENCOUNTER — Ambulatory Visit
Admission: RE | Admit: 2018-05-09 | Discharge: 2018-05-09 | Disposition: A | Discharge status: Home / Self Care | Payer: No Typology Code available for payment source | Source: Ambulatory Visit | Attending: Internal Medicine | Admitting: Internal Medicine

## 2018-05-09 ENCOUNTER — Other Ambulatory Visit: Payer: Self-pay

## 2018-05-09 DIAGNOSIS — M5030 Other cervical disc degeneration, unspecified cervical region: Secondary | ICD-10-CM

## 2019-01-27 ENCOUNTER — Other Ambulatory Visit: Payer: Self-pay

## 2019-01-27 ENCOUNTER — Telehealth: Payer: No Typology Code available for payment source | Admitting: Physician Assistant

## 2019-01-27 DIAGNOSIS — Z20822 Contact with and (suspected) exposure to covid-19: Secondary | ICD-10-CM

## 2019-01-27 NOTE — Progress Notes (Signed)
E-Visit for Corona Virus Screening   Your current symptoms could be consistent with the coronavirus.  Many health care providers can now test patients at their office but not all are.  [facility] has multiple testing sites. For information on our COVID testing locations and hours go to [email or web address]  Please quarantine yourself while awaiting your test results.  We are enrolling you in our MyChart Home [facility] for COVID19 . Daily you will receive a questionnaire within the MyChart website. Our COVID 19 response team willl be monitoriing your responses daily. Please continue good preventive care measures, including:  frequent hand-washing, avoid touching your face, cover coughs/sneezes, stay out of crowds and keep a 6 foot distance from others.    COVID-19 is a respiratory illness with symptoms that are similar to the flu. Symptoms are typically mild to moderate, but there have been cases of severe illness and death due to the virus. The following symptoms may appear 2-14 days after exposure: . Fever . Cough . Shortness of breath or difficulty breathing . Chills . Repeated shaking with chills . Muscle pain . Headache . Sore throat . New loss of taste or smell . Fatigue . Congestion or runny nose . Nausea or vomiting . Diarrhea  If you develop fever/cough/breathlessness, please stay home for 10 days with improving symptoms and until you have had 24 hours of no fever (without taking a fever reducer).  Go to the nearest hospital ED for assessment if fever/cough/breathlessness are severe or illness seems like a threat to life.  It is vitally important that if you feel that you have an infection such as this virus or any other virus that you stay home and away from places where you may spread it to others.  You should avoid contact with people age 65 and older.   You should wear a mask or cloth face covering over your nose and mouth if you must be around other  people or animals, including pets (even at home). Try to stay at least 6 feet away from other people. This will protect the people around you.   You may also take acetaminophen (Tylenol) as needed for fever.   Reduce your risk of any infection by using the same precautions used for avoiding the common cold or flu:  . Wash your hands often with soap and warm water for at least 20 seconds.  If soap and water are not readily available, use an alcohol-based hand sanitizer with at least 60% alcohol.  . If coughing or sneezing, cover your mouth and nose by coughing or sneezing into the elbow areas of your shirt or coat, into a tissue or into your sleeve (not your hands). . Avoid shaking hands with others and consider head nods or verbal greetings only. . Avoid touching your eyes, nose, or mouth with unwashed hands.  . Avoid close contact with people who are sick. . Avoid places or events with large numbers of people in one location, like concerts or sporting events. . Carefully consider travel plans you have or are making. . If you are planning any travel outside or inside the [place], visit the CDC's Travelers' Health webpage for the latest health notices. . If you have some symptoms but not all symptoms, continue to monitor at home and seek medical attention if your symptoms worsen. . If you are having a medical emergency, call 911.  HOME CARE . Only take medications as instructed by your medical team. . Drink plenty of fluids and   get plenty of rest. . A steam or ultrasonic humidifier can help if you have congestion.   GET HELP RIGHT AWAY IF YOU HAVE EMERGENCY WARNING SIGNS** FOR COVID-19. If you or someone is showing any of these signs seek emergency medical care immediately. Call 911 or proceed to your closest emergency facility if: . You develop worsening high fever. . Trouble breathing . Bluish lips or face . Persistent pain or pressure in the chest . New confusion . Inability to wake or stay  awake . You cough up blood. . Your symptoms become more severe  **This list is not all possible symptoms. Contact your medical provider for any symptoms that are sever or concerning to you.   MAKE SURE YOU   Understand these instructions.  Will watch your condition.  Will get help right away if you are not doing well or get worse.  Your e-visit answers were reviewed by a board certified advanced clinical practitioner to complete your personal care plan.  Depending on the condition, your plan could have included both over the counter or prescription medications.  If there is a problem please reply once you have received a response from your provider.  Your safety is important to us.  If you have drug allergies check your prescription carefully.    You can use MyChart to ask questions about today's visit, request a non-urgent call back, or ask for a work or school excuse for 24 hours related to this e-Visit. If it has been greater than 24 hours you will need to follow up with your provider, or enter a new e-Visit to address those concerns. You will get an e-mail in the next two days asking about your experience.  I hope that your e-visit has been valuable and will speed your recovery. Thank you for using e-visits.    

## 2019-01-27 NOTE — Progress Notes (Signed)
I have spent 5 minutes in review of e-visit questionnaire, review and updating patient chart, medical decision making and response to patient.   Cendy Oconnor Cody Brendin Situ, PA-C    

## 2019-01-30 LAB — NOVEL CORONAVIRUS, NAA: SARS-CoV-2, NAA: NOT DETECTED

## 2019-05-03 ENCOUNTER — Encounter: Payer: Self-pay | Admitting: Gastroenterology

## 2019-06-05 ENCOUNTER — Other Ambulatory Visit: Payer: Self-pay

## 2019-06-05 DIAGNOSIS — K219 Gastro-esophageal reflux disease without esophagitis: Secondary | ICD-10-CM | POA: Insufficient documentation

## 2019-06-05 DIAGNOSIS — R1310 Dysphagia, unspecified: Secondary | ICD-10-CM | POA: Insufficient documentation

## 2019-06-07 ENCOUNTER — Encounter: Payer: Self-pay | Admitting: Gastroenterology

## 2019-06-07 ENCOUNTER — Ambulatory Visit: Payer: 59 | Admitting: Gastroenterology

## 2019-06-07 ENCOUNTER — Other Ambulatory Visit (INDEPENDENT_AMBULATORY_CARE_PROVIDER_SITE_OTHER): Payer: 59

## 2019-06-07 DIAGNOSIS — K219 Gastro-esophageal reflux disease without esophagitis: Secondary | ICD-10-CM

## 2019-06-07 DIAGNOSIS — R1319 Other dysphagia: Secondary | ICD-10-CM

## 2019-06-07 LAB — IGA: IgA: 293 mg/dL (ref 68–378)

## 2019-06-07 NOTE — Patient Instructions (Addendum)
If you are age 37 or older, your body mass index should be between 23-30. Your Body mass index is 33.2 kg/m. If this is out of the aforementioned range listed, please consider follow up with your Primary Care Provider.  If you are age 11 or younger, your body mass index should be between 19-25. Your Body mass index is 33.2 kg/m. If this is out of the aformentioned range listed, please consider follow up with your Primary Care Provider.   You have been scheduled for a colonoscopy. Please follow written instructions given to you at your visit today.  Please pick up your prep supplies at the pharmacy within the next 1-3 days. If you use inhalers (even only as needed), please bring them with you on the day of your procedure.  Your provider has requested that you go to the basement level for lab work before leaving today. Press "B" on the elevator. The lab is located at the first door on the left as you exit the elevator.  Due to recent changes in healthcare laws, you may see the results of your imaging and laboratory studies on MyChart before your provider has had a chance to review them.  We understand that in some cases there may be results that are confusing or concerning to you. Not all laboratory results come back in the same time frame and the provider may be waiting for multiple results in order to interpret others.  Please give [place] 48 hours in order for your provider to thoroughly review all the results before contacting the office for clarification of your results.   Thank you for entrusting me with your care and choosing [facility].  Dr Christella Hartigan

## 2019-06-07 NOTE — Progress Notes (Signed)
HPI: This is a very pleasant 39 year old man who was referred to me by Crist Infante, MD  to evaluate esophageal spasms, chronic GERD, intermittent dysphagia.    He has chronic GERD for many years.  Acid taste in his mouth burning in his chest.  He was given omeprazole 20 mg pills about a year and a half ago and he has been taking 1 pill once daily since then and on that his typical GERD symptoms have completely resolved.  He has not tried to stop taking it.  For years he has had intermittent tightness in his esophagus.  Sometimes this occurs while eating and sometimes it is a dysphagia-like symptoms.  Other times it can happen just randomly throughout the day not when he is eating.  He will feel fullness in his throat and shoulder and chest.  One time when running he had a sensation like this and felt like there was gobs of saliva stuck in his esophagus.  He has chronic loose stools, this has been going on since he was a teenager.  Never really solid stools but always soft and sometimes watery.  He has not found that this is related to any particular foods that he is eating.  He does not see blood in his stool.  Old Data Reviewed: Blood work March 2021 shows normal CBC and normal complete metabolic profile   Review of systems: Pertinent positive and negative review of systems were noted in the above HPI section. All other review negative.   Past Medical History:  Diagnosis Date  . Dysphagia   . Esophageal spasm   . GERD (gastroesophageal reflux disease)   . Hypertension   . Migraine     Past Surgical History:  Procedure Laterality Date  . NO PAST SURGERIES    . WISDOM TOOTH EXTRACTION      Current Outpatient Medications  Medication Sig Dispense Refill  . ibuprofen (ADVIL) 200 MG tablet Take 200 mg by mouth every 6 (six) hours as needed.    . Multiple Vitamin (MULTIVITAMIN) tablet Take 1 tablet by mouth daily.    Marland Kitchen omeprazole (PRILOSEC) 20 MG capsule Take 20 mg by mouth daily.      . sertraline (ZOLOFT) 100 MG tablet Take 150 mg by mouth daily.      No current facility-administered medications for this visit.    Allergies as of 06/07/2019  . (No Known Allergies)    Family History  Problem Relation Age of Onset  . Migraines Mother   . Irritable bowel syndrome Mother   . Cancer Father   . Colon cancer Maternal Grandmother   . Breast cancer Maternal Grandmother   . Stroke Maternal Grandmother   . Heart attack Maternal Grandfather 7    Social History   Socioeconomic History  . Marital status: Married    Spouse name: Gildardo Cranker  . Number of children: 3  . Years of education: MBA  . Highest education level: Not on file  Occupational History  . Occupation: [employer]  Tobacco Use  . Smoking status: Former Smoker    Packs/day: 1.00    Types: Cigarettes    Quit date: 06/06/2016    Years since quitting: 3.0  . Smokeless tobacco: Never Used  Substance and Sexual Activity  . Alcohol use: Yes    Comment: social-beer  . Drug use: No  . Sexual activity: Not on file  Other Topics Concern  . Not on file  Social History Narrative   Lives  With wife,  Merla Riches and children   Caffeine use:  Soda- several per day   Sweet tea daily   Social Determinants of Health   Financial Resource Strain:   . Difficulty of Paying Living Expenses:   Food Insecurity:   . Worried About Programme researcher, broadcasting/film/video in the Last Year:   . Barista in the Last Year:   Transportation Needs:   . Freight forwarder (Medical):   Marland Kitchen Lack of Transportation (Non-Medical):   Physical Activity:   . Days of Exercise per Week:   . Minutes of Exercise per Session:   Stress:   . Feeling of Stress :   Social Connections:   . Frequency of Communication with Friends and Family:   . Frequency of Social Gatherings with Friends and Family:   . Attends Religious Services:   . Active Member of Clubs or Organizations:   . Attends Banker Meetings:   Marland Kitchen Marital Status:    Intimate Partner Violence:   . Fear of Current or Ex-Partner:   . Emotionally Abused:   Marland Kitchen Physically Abused:   . Sexually Abused:      Physical Exam: BP 132/88 (BP Location: Left Arm, Patient Position: Sitting, Cuff Size: Normal)   Pulse 68   Temp 97.9 F (36.6 C)   Ht 5\' 8"  (1.727 m)   Wt 218 lb 6 oz (99.1 kg)   SpO2 99%   BMI 33.20 kg/m  Constitutional: generally well-appearing Psychiatric: alert and oriented x3 Eyes: extraocular movements intact Mouth: oral pharynx moist, no lesions Neck: supple no lymphadenopathy Cardiovascular: heart regular rate and rhythm Lungs: clear to auscultation bilaterally Abdomen: soft, nontender, nondistended, no obvious ascites, no peritoneal signs, normal bowel sounds Extremities: no lower extremity edema bilaterally Skin: no lesions on visible extremities   Assessment and plan: 39 y.o. male with chronic GERD, intermittent dysphagia, intermittent spasms in his chest, chronic loose stools  First clearly has GERD given his heartburn and acid taste in his mouth that is completely resolved since he started taking omeprazole.  Perhaps he also has underlying esophageal spasms.  Possibly a motility disorder.  He does not describe typical dysphagia for the most part but he has had a few dysphagia episodes.  More than that he has fullness in his chest that can come out of the blue in his throat.  Possibly he has underlying esophageal spasm.  Possibly eosinophilic esophagitis.  I recommended EGD at his soonest convenience for evaluation.  He will continue taking omeprazole once daily for now.  Chronic loose stools and 30 heritage certainly suggests that he might have underlying celiac sprue.  He will have serologies tested today .    Please see the "Patient Instructions" section for addition details about the plan.   [place], MD [facility] 06/07/2019, 3:26 PM  Cc: 06/09/2019, MD  Total time on date of encounter  was 45  minutes (this included time spent preparing to see the patient reviewing records; obtaining and/or reviewing separately obtained history; performing a medically appropriate exam and/or evaluation; counseling and educating the patient and family if present; ordering medications, tests or procedures if applicable; and documenting clinical information in the health record).

## 2019-06-08 LAB — TISSUE TRANSGLUTAMINASE, IGA: (tTG) Ab, IgA: 1 U/mL

## 2019-07-04 ENCOUNTER — Telehealth: Payer: Self-pay | Admitting: Gastroenterology

## 2019-07-04 NOTE — Telephone Encounter (Signed)
I left a message in case he calls back to reschedule. I moved it to 08/04/19 at 4 pm.  If he calls you will you ask if he has had his COVID immunization.

## 2019-07-04 NOTE — Telephone Encounter (Signed)
[name], Stephen Ayala is aware of this case as well. Do we need to reschedule patient for tomorrow until we get the auth or what is the urgency of the patient having the procedure?

## 2019-07-04 NOTE — Telephone Encounter (Signed)
[name], pt is scheduled for a Endo tomorrow.  Insurance has not approved it yet, I spoke with a clinical nurse to attempt to get it approved and she said it could take up to 14 days.  Please advise if we should reschedule procedure or the urgency of patient having the procedure tomorrow.  Thanks

## 2019-07-04 NOTE — Telephone Encounter (Signed)
Not urgent. This can wait 2-3 weeks safely.

## 2019-07-04 NOTE — Telephone Encounter (Signed)
The patient has been notified of this information and all questions answered. The pt states he has been immunized at least 14 days prior to the procedure. He was re- instructed and has no questions at this time.

## 2019-07-04 NOTE — Telephone Encounter (Signed)
Dr Christella Hartigan is there any urgency in this EGD?  Can it be rescheduled?  Stephen Ayala he may need to be rescheduled.

## 2019-07-05 ENCOUNTER — Encounter: Payer: 59 | Admitting: Gastroenterology

## 2019-07-18 ENCOUNTER — Encounter: Payer: Self-pay | Admitting: Gastroenterology

## 2019-08-03 ENCOUNTER — Telehealth: Payer: Self-pay | Admitting: Gastroenterology

## 2019-08-03 NOTE — Telephone Encounter (Signed)
Dr. Christella Hartigan, this patient decided to cancel his EGD scheduled 08/04/19 at 3:00 pm due to financial concerns.  He stated that he was not aware that it would be a diagnostic procedure.

## 2019-08-04 ENCOUNTER — Encounter: Payer: 59 | Admitting: Gastroenterology

## 2019-09-25 ENCOUNTER — Ambulatory Visit: Payer: 59 | Admitting: Physician Assistant

## 2019-12-20 ENCOUNTER — Ambulatory Visit: Payer: 59 | Admitting: Physician Assistant

## 2020-07-31 ENCOUNTER — Ambulatory Visit: Payer: 59 | Admitting: Dermatology

## 2020-11-22 ENCOUNTER — Other Ambulatory Visit: Payer: Self-pay | Admitting: Internal Medicine

## 2020-11-22 DIAGNOSIS — E785 Hyperlipidemia, unspecified: Secondary | ICD-10-CM

## 2020-12-11 ENCOUNTER — Ambulatory Visit
Admission: RE | Admit: 2020-12-11 | Discharge: 2020-12-11 | Disposition: A | Discharge status: Home / Self Care | Payer: 59 | Source: Ambulatory Visit | Attending: Internal Medicine | Admitting: Internal Medicine

## 2020-12-11 DIAGNOSIS — E785 Hyperlipidemia, unspecified: Secondary | ICD-10-CM

## 2021-09-04 ENCOUNTER — Ambulatory Visit (HOSPITAL_COMMUNITY)
Admission: EM | Admit: 2021-09-04 | Discharge: 2021-09-04 | Disposition: A | Discharge status: Home / Self Care | Payer: No Typology Code available for payment source | Attending: Internal Medicine | Admitting: Internal Medicine

## 2021-09-04 ENCOUNTER — Encounter (HOSPITAL_COMMUNITY): Payer: Self-pay | Admitting: Emergency Medicine

## 2021-09-04 DIAGNOSIS — S0181XA Laceration without foreign body of other part of head, initial encounter: Secondary | ICD-10-CM

## 2021-09-04 MED ORDER — BACITRACIN ZINC 500 UNIT/GM EX OINT
TOPICAL_OINTMENT | CUTANEOUS | Status: AC
  Filled 2021-09-04: qty 0.9

## 2021-09-04 MED ORDER — LIDOCAINE-EPINEPHRINE 1 %-1:100000 IJ SOLN
INTRAMUSCULAR | Status: AC
  Filled 2021-09-04: qty 1

## 2021-09-04 NOTE — Discharge Instructions (Signed)
Daily wound dressing changes with topical antibiotic ointment Okay for mild soap and water to come into contact with the wound 72 hours or more after suturing Please take Tylenol or Motrin as needed for pain If you observe worsening redness, swelling, discharge, pain-please return to urgent care to be reevaluated If you have persistent headache, nausea and/or persistent vomiting or confusion please return to urgent care to be reevaluated Return to urgent care in 5 to 7 days for suture removal.

## 2021-09-04 NOTE — ED Triage Notes (Signed)
Pt reports falling and hitting his face on the base of a floor lamp this morning. Pt does have a small abrasion under left eye and knot on the left side of his head. Denies LOC.

## 2021-09-06 NOTE — ED Provider Notes (Signed)
[facility]    CSN: [id number] Arrival date & time: 09/04/21  6063      History   Chief Complaint Chief Complaint  Patient presents with   Facial Injury    HPI Stephen Ayala is a 41 y.o. male comes to the urgent care with a laceration over the left side of the face and swelling over the left forehead.  Patient fell and hit the left side of the face against a floor lamp this morning.  He denie loss of consciousness, nausea or feeling foggy headed.  Patient denies blurry vision or double vision.  He developed swelling over the left forehead following the fall.  Patient also has a laceration in the left periorbital area.  Bleeding is controlled.  HPI  Past Medical History:  Diagnosis Date   Dysphagia    Esophageal spasm    GERD (gastroesophageal reflux disease)    Hypertension    Migraine     Patient Active Problem List   Diagnosis Date Noted   GERD (gastroesophageal reflux disease) 06/05/2019   Dysphagia 06/05/2019   Migraine without aura and without status migrainosus, not intractable 11/03/2015   Chest pain at rest 01/17/2013    Past Surgical History:  Procedure Laterality Date   NO PAST SURGERIES     WISDOM TOOTH EXTRACTION         Home Medications    Prior to Admission medications   Medication Sig Start Date End Date Taking? Authorizing Provider  ibuprofen (ADVIL) 200 MG tablet Take 200 mg by mouth every 6 (six) hours as needed.    [provider]  Multiple Vitamin (MULTIVITAMIN) tablet Take 1 tablet by mouth daily.    [provider]  omeprazole (PRILOSEC) 20 MG capsule Take 20 mg by mouth daily.  05/23/19   [provider]  sertraline (ZOLOFT) 100 MG tablet Take 150 mg by mouth daily.  03/30/19   [provider]    Family History Family History  Problem Relation Age of Onset   Migraines Mother    Irritable bowel syndrome Mother    Cancer Father    Colon cancer Maternal Grandmother    Breast cancer Maternal  Grandmother    Stroke Maternal Grandmother    Heart attack Maternal Grandfather 17    Social History Social History   Tobacco Use   Smoking status: Former    Packs/day: 1.00    Types: Cigarettes    Quit date: 06/06/2016    Years since quitting: 5.2   Smokeless tobacco: Never  Vaping Use   Vaping Use: Never used  Substance Use Topics   Alcohol use: Yes    Comment: social-beer   Drug use: No     Allergies   Patient has no known allergies.   Review of Systems Review of Systems  Constitutional: Negative.   HENT:  Positive for facial swelling.   Gastrointestinal: Negative.   Genitourinary: Negative.   Neurological:  Positive for headaches.  Psychiatric/Behavioral:  Negative for confusion and decreased concentration.      Physical Exam Triage Vital Signs ED Triage Vitals  Enc Vitals Group     BP 09/04/21 0946 135/84     Pulse Rate 09/04/21 0946 67     Resp 09/04/21 0946 18     Temp 09/04/21 0946 98.1 F (36.7 C)     Temp Source 09/04/21 0946 Oral     SpO2 09/04/21 0946 97 %     Weight --      Height --  Head Circumference --      Peak Flow --      Pain Score 09/04/21 0945 1     Pain Loc --      Pain Edu? --      Excl. in GC? --    No data found.  Updated Vital Signs BP 135/84 (BP Location: Left Arm)   Pulse 67   Temp 98.1 F (36.7 C) (Oral)   Resp 18   SpO2 97%   Visual Acuity Right Eye Distance:   Left Eye Distance:   Bilateral Distance:    Right Eye Near:   Left Eye Near:    Bilateral Near:     Physical Exam Vitals reviewed.  Constitutional:      Appearance: He is not ill-appearing.  HENT:     Head:     Comments: Left frontal hematoma.  Laceration just lateral to the left upper eyelid.  Laceration measures about 1 inch in the longest diameter with jagged edges and it is about 5 mm deep.  Another laceration is just lateral to the left lower eyelid and also measures about an inch and a half long with a depth of about 6 mm.  Bleeding is  controlled.    Right Ear: Tympanic membrane normal.     Left Ear: Tympanic membrane normal.  Cardiovascular:     Rate and Rhythm: Normal rate and regular rhythm.  Neurological:     Mental Status: He is alert.      UC Treatments / Results  Labs (all labs ordered are listed, but only abnormal results are displayed) Labs Reviewed - No data to display  EKG   Radiology No results found.  Procedures Laceration Repair  Date/Time: 09/06/2021 9:47 AM  Performed by: Merrilee Jansky, MD Authorized by: Merrilee Jansky, MD   Consent:    Consent obtained:  Verbal   Consent given by:  Patient   Risks discussed:  Infection and pain Universal protocol:    Patient identity confirmed:  Verbally with patient Anesthesia:    Anesthesia method:  Local infiltration   Local anesthetic:  Lidocaine 1% WITH epi Laceration details:    Location:  Face   Face location:  L lower eyelid   Extent:  Full thickness   Length (cm):  5   Depth (mm):  6 Pre-procedure details:    Preparation:  Patient was prepped and draped in usual sterile fashion Exploration:    Wound exploration: wound explored through full range of motion     Wound extent: no foreign bodies/material noted and no muscle damage noted     Contaminated: no   Treatment:    Area cleansed with:  Shur-Clens   Amount of cleaning:  Standard Skin repair:    Repair method:  Sutures   Suture size:  6-0   Suture material:  Prolene   Suture technique:  Simple interrupted   Number of sutures:  8 Approximation:    Approximation:  Close Repair type:    Repair type:  Simple Post-procedure details:    Procedure completion:  Tolerated well, no immediate complications  (including critical care time)  Medications Ordered in UC Medications - No data to display  Initial Impression / Assessment and Plan / UC Course  I have reviewed the triage vital signs and the nursing notes.  Pertinent labs & imaging results that were available during  my care of the patient were reviewed by me and considered in my medical decision making (see chart for  details).     Left frontal hematoma: Concussion precautions given Return precautions given Tylenol as needed for pain  2.  Left facial laceration: Laceration repair completed Return to urgent care in 5 to 7 days for suture removal Tylenol or Motrin as needed for pain Wound care instructions given and precautions also given. Return precautions given. Final Clinical Impressions(s) / UC Diagnoses   Final diagnoses:  Facial laceration, initial encounter     Discharge Instructions      Daily wound dressing changes with topical antibiotic ointment Okay for mild soap and water to come into contact with the wound 72 hours or more after suturing Please take Tylenol or Motrin as needed for pain If you observe worsening redness, swelling, discharge, pain-please return to urgent care to be reevaluated If you have persistent headache, nausea and/or persistent vomiting or confusion please return to urgent care to be reevaluated Return to urgent care in 5 to 7 days for suture removal.   ED Prescriptions   None    PDMP not reviewed this encounter.   Merrilee Jansky, MD 09/06/21 [phone]

## 2021-09-29 DIAGNOSIS — F419 Anxiety disorder, unspecified: Secondary | ICD-10-CM | POA: Diagnosis not present

## 2022-04-14 DIAGNOSIS — M7711 Lateral epicondylitis, right elbow: Secondary | ICD-10-CM | POA: Diagnosis not present

## 2022-10-06 DIAGNOSIS — E669 Obesity, unspecified: Secondary | ICD-10-CM | POA: Diagnosis not present

## 2022-10-06 DIAGNOSIS — G4719 Other hypersomnia: Secondary | ICD-10-CM | POA: Diagnosis not present

## 2023-03-12 DIAGNOSIS — G4761 Periodic limb movement disorder: Secondary | ICD-10-CM | POA: Diagnosis not present

## 2023-03-12 DIAGNOSIS — G4733 Obstructive sleep apnea (adult) (pediatric): Secondary | ICD-10-CM | POA: Diagnosis not present

## 2023-03-22 DIAGNOSIS — G4733 Obstructive sleep apnea (adult) (pediatric): Secondary | ICD-10-CM | POA: Diagnosis not present

## 2023-04-01 DIAGNOSIS — G4733 Obstructive sleep apnea (adult) (pediatric): Secondary | ICD-10-CM | POA: Diagnosis not present

## 2023-04-07 IMAGING — CT CT CARDIAC CORONARY ARTERY CALCIUM SCORE
3 series · 14 of 20 positions shown, 16 images · non-contrast
Comparison: None.

CLINICAL DATA: 40-year-old Caucasian male with history of
hyperlipidemia, hypertension, family history of heart disease and
prior smoking history.

EXAM:
CT CARDIAC CORONARY ARTERY CALCIUM SCORE
TECHNIQUE: Non-contrast imaging through the heart was performed using
prospective ECG gating. Image post processing was performed on an
independent workstation, allowing for quantitative analysis of the
heart and coronary arteries. Note that this exam targets the heart
and the chest was not imaged in its entirety.

[Series 2: calcium scoring 2.00 qr36 bestdiast 70% hrt calciu · axial · 0.46mm/px · z∈[+1726,+1798]mm · 4 of 60 slices shown]
[im 12/60  vessel]
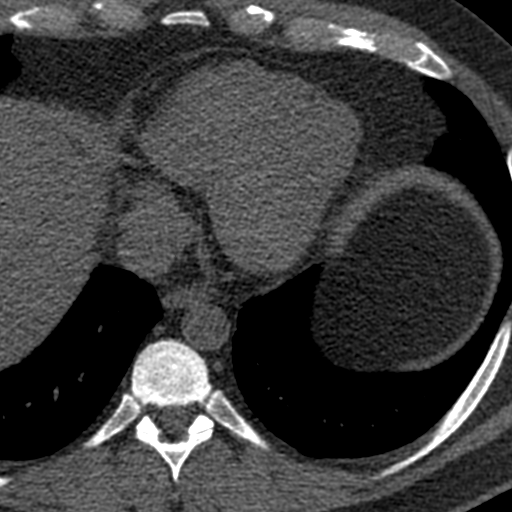
[im 24/60  vessel]
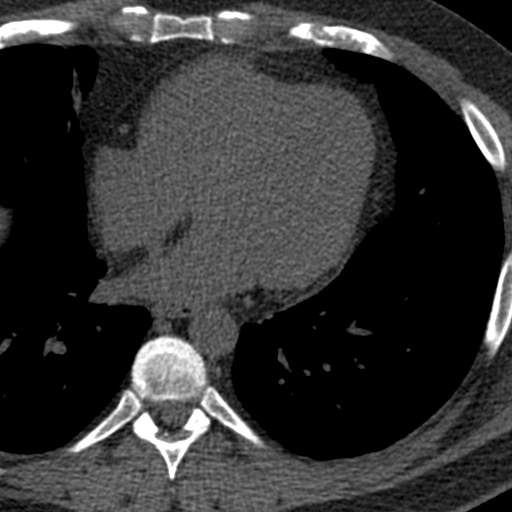
[im 36/60  vessel]
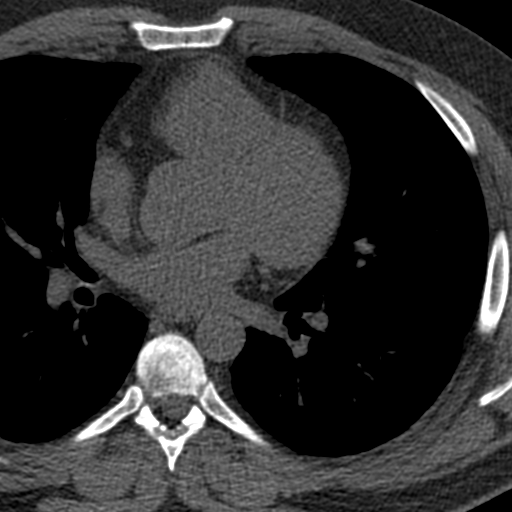
[im 48/60  vessel]
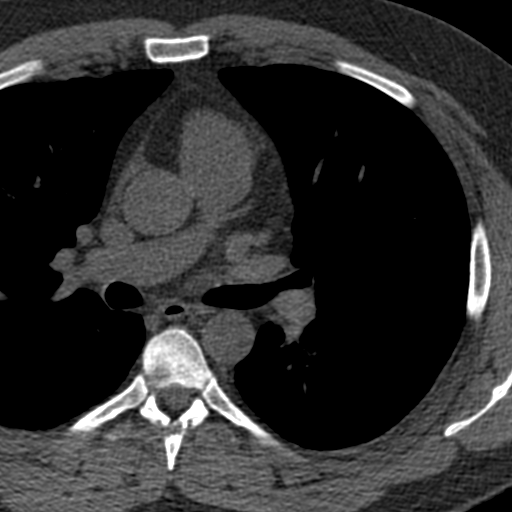

[Series 3: calcium scoring 2.00 br40 bestdiast 70% axial · axial · 0.58mm/px · z∈[+1722,+1802]mm · 5 of 60 slices shown, 7 images]
[im 10/60  vessel]
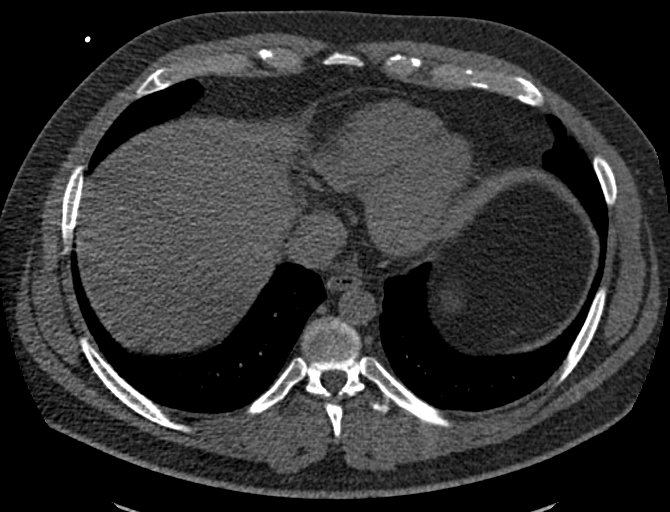
[im 10/60  lung]
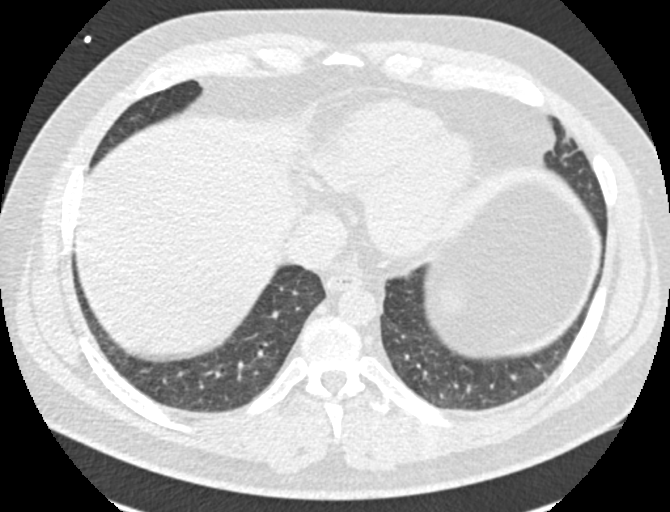
[im 20/60  vessel]
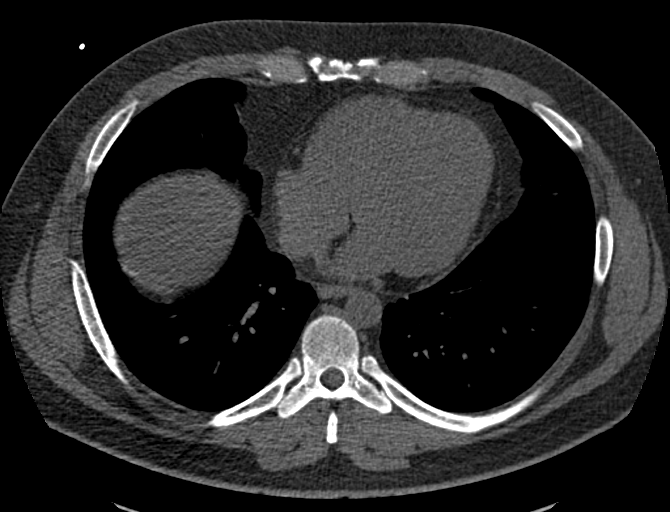
[im 30/60  vessel]
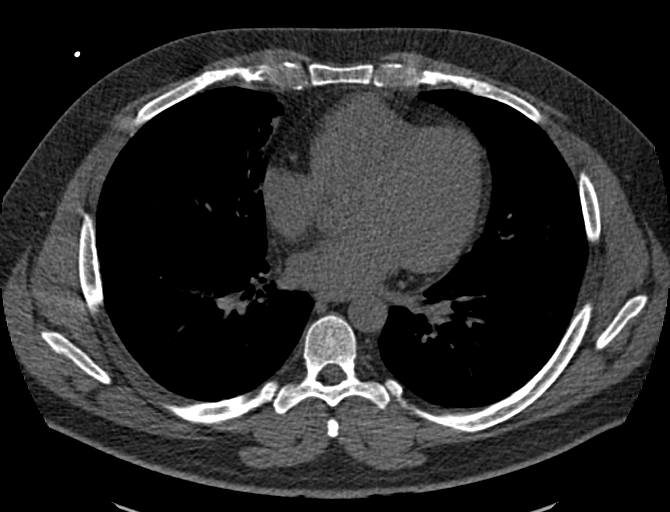
[im 40/60  vessel]
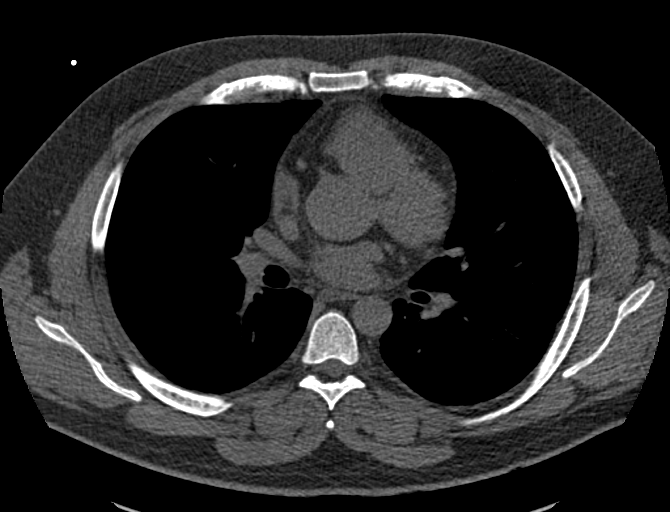
[im 50/60  vessel]
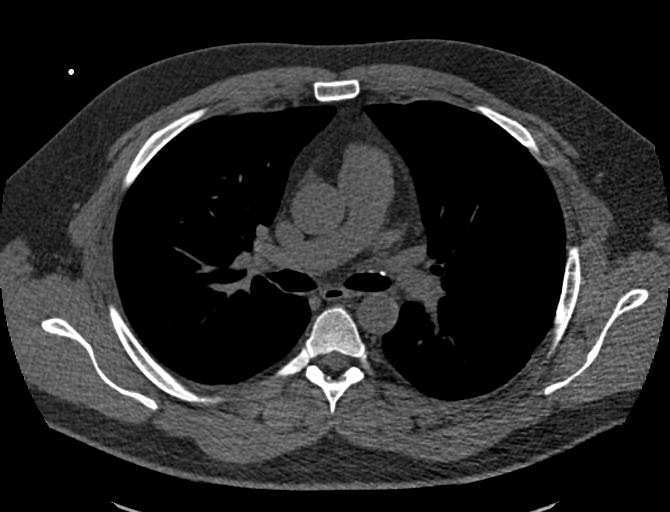
[im 50/60  lung]
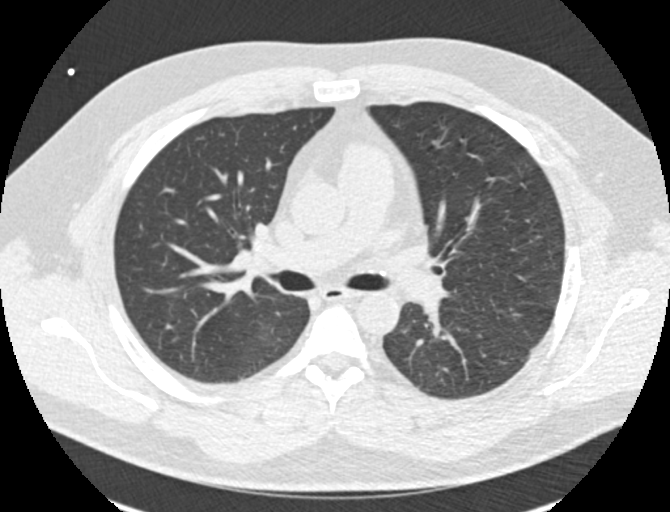

[Series 9: calcium scoring 2.00 br60 bestdiast 70% lungs · axial · 0.59mm/px · z∈[+1722,+1802]mm · 5 of 60 slices shown]
[im 10/60  vessel]
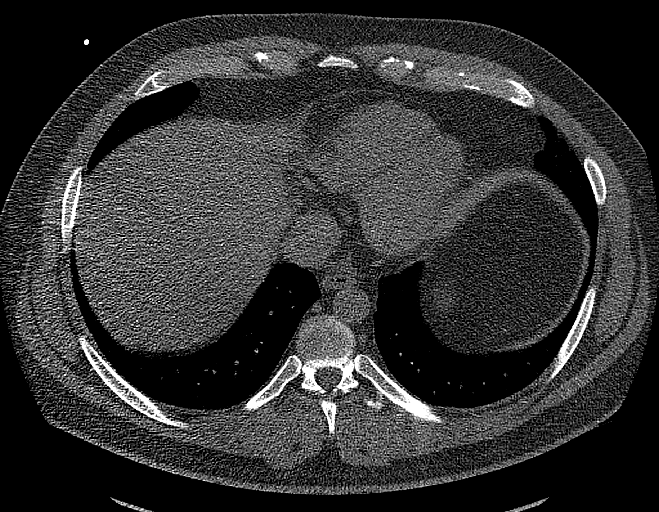
[im 20/60  vessel]
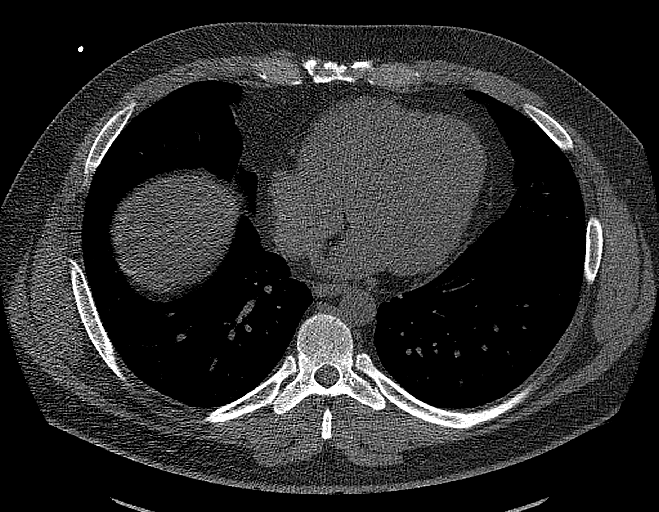
[im 30/60  vessel]
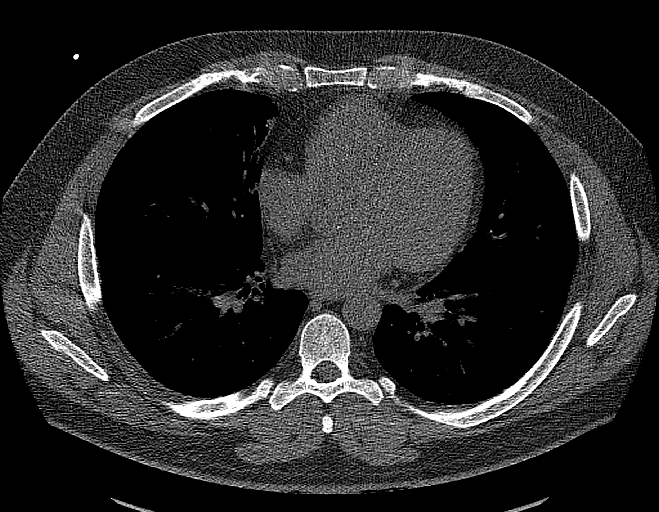
[im 40/60  vessel]
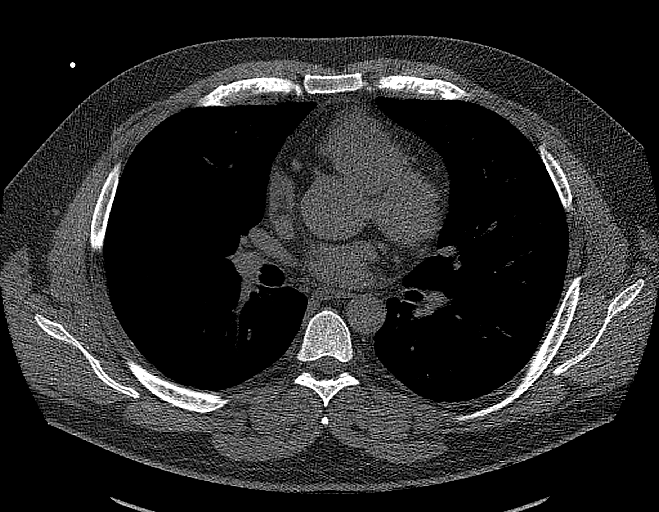
[im 50/60  vessel]
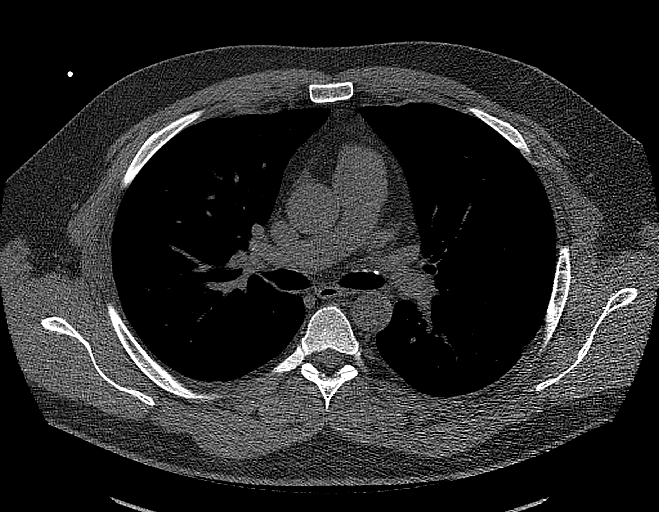

[14 of 20 positions shown; findings below may reference images not displayed]

FINDINGS: CORONARY CALCIUM SCORES:

Left Main: 0

LAD: 0

LCx: 0

RCA: 0

Total Agatston Score: 0

[HOSPITAL] percentile: 0

AORTA MEASUREMENTS:

Ascending Aorta: 31 mm

Descending Aorta: 24 mm

OTHER FINDINGS:


## 2023-04-29 DIAGNOSIS — G4733 Obstructive sleep apnea (adult) (pediatric): Secondary | ICD-10-CM | POA: Diagnosis not present

## 2023-05-05 DIAGNOSIS — L710 Perioral dermatitis: Secondary | ICD-10-CM | POA: Diagnosis not present

## 2023-05-05 DIAGNOSIS — E785 Hyperlipidemia, unspecified: Secondary | ICD-10-CM | POA: Diagnosis not present

## 2023-05-05 DIAGNOSIS — G4733 Obstructive sleep apnea (adult) (pediatric): Secondary | ICD-10-CM | POA: Diagnosis not present

## 2023-05-05 DIAGNOSIS — H60392 Other infective otitis externa, left ear: Secondary | ICD-10-CM | POA: Diagnosis not present

## 2023-05-30 DIAGNOSIS — G4733 Obstructive sleep apnea (adult) (pediatric): Secondary | ICD-10-CM | POA: Diagnosis not present

## 2023-06-21 DIAGNOSIS — G4733 Obstructive sleep apnea (adult) (pediatric): Secondary | ICD-10-CM | POA: Diagnosis not present

## 2023-06-23 ENCOUNTER — Encounter (INDEPENDENT_AMBULATORY_CARE_PROVIDER_SITE_OTHER): Payer: Self-pay | Admitting: Otolaryngology

## 2023-06-29 DIAGNOSIS — G4733 Obstructive sleep apnea (adult) (pediatric): Secondary | ICD-10-CM | POA: Diagnosis not present

## 2023-07-01 ENCOUNTER — Encounter (INDEPENDENT_AMBULATORY_CARE_PROVIDER_SITE_OTHER): Payer: Self-pay | Admitting: Otolaryngology

## 2023-07-28 DIAGNOSIS — K219 Gastro-esophageal reflux disease without esophagitis: Secondary | ICD-10-CM | POA: Diagnosis not present

## 2023-07-28 DIAGNOSIS — E291 Testicular hypofunction: Secondary | ICD-10-CM | POA: Diagnosis not present

## 2023-07-28 DIAGNOSIS — E785 Hyperlipidemia, unspecified: Secondary | ICD-10-CM | POA: Diagnosis not present

## 2023-07-28 DIAGNOSIS — E559 Vitamin D deficiency, unspecified: Secondary | ICD-10-CM | POA: Diagnosis not present

## 2023-07-28 DIAGNOSIS — Z1212 Encounter for screening for malignant neoplasm of rectum: Secondary | ICD-10-CM | POA: Diagnosis not present

## 2023-07-28 DIAGNOSIS — Z125 Encounter for screening for malignant neoplasm of prostate: Secondary | ICD-10-CM | POA: Diagnosis not present

## 2023-07-28 DIAGNOSIS — Z79899 Other long term (current) drug therapy: Secondary | ICD-10-CM | POA: Diagnosis not present

## 2023-08-03 DIAGNOSIS — R82998 Other abnormal findings in urine: Secondary | ICD-10-CM | POA: Diagnosis not present

## 2023-09-14 DIAGNOSIS — E669 Obesity, unspecified: Secondary | ICD-10-CM | POA: Diagnosis not present

## 2023-09-14 DIAGNOSIS — E785 Hyperlipidemia, unspecified: Secondary | ICD-10-CM | POA: Diagnosis not present

## 2023-09-14 DIAGNOSIS — G4730 Sleep apnea, unspecified: Secondary | ICD-10-CM | POA: Diagnosis not present

## 2023-09-14 DIAGNOSIS — E8881 Metabolic syndrome: Secondary | ICD-10-CM | POA: Diagnosis not present

## 2023-12-07 DIAGNOSIS — M25522 Pain in left elbow: Secondary | ICD-10-CM | POA: Diagnosis not present

## 2024-01-31 ENCOUNTER — Other Ambulatory Visit: Payer: Self-pay | Admitting: Nurse Practitioner

## 2024-01-31 DIAGNOSIS — R7301 Impaired fasting glucose: Secondary | ICD-10-CM

## 2024-01-31 DIAGNOSIS — R7989 Other specified abnormal findings of blood chemistry: Secondary | ICD-10-CM

## 2024-01-31 DIAGNOSIS — E8881 Metabolic syndrome: Secondary | ICD-10-CM

## 2024-02-03 DIAGNOSIS — G4733 Obstructive sleep apnea (adult) (pediatric): Secondary | ICD-10-CM | POA: Diagnosis not present

## 2024-02-03 DIAGNOSIS — Z6838 Body mass index (BMI) 38.0-38.9, adult: Secondary | ICD-10-CM | POA: Diagnosis not present

## 2024-02-06 DIAGNOSIS — R030 Elevated blood-pressure reading, without diagnosis of hypertension: Secondary | ICD-10-CM | POA: Diagnosis not present

## 2024-02-06 DIAGNOSIS — M25474 Effusion, right foot: Secondary | ICD-10-CM | POA: Diagnosis not present

## 2024-02-06 DIAGNOSIS — M109 Gout, unspecified: Secondary | ICD-10-CM | POA: Diagnosis not present

## 2024-02-06 DIAGNOSIS — M79671 Pain in right foot: Secondary | ICD-10-CM | POA: Diagnosis not present

## 2024-02-09 ENCOUNTER — Inpatient Hospital Stay: Admission: RE | Admit: 2024-02-09 | Discharge: 2024-02-09 | Attending: Nurse Practitioner

## 2024-02-09 DIAGNOSIS — R7989 Other specified abnormal findings of blood chemistry: Secondary | ICD-10-CM

## 2024-02-09 DIAGNOSIS — R7301 Impaired fasting glucose: Secondary | ICD-10-CM

## 2024-02-09 DIAGNOSIS — K760 Fatty (change of) liver, not elsewhere classified: Secondary | ICD-10-CM | POA: Diagnosis not present

## 2024-02-09 DIAGNOSIS — E8881 Metabolic syndrome: Secondary | ICD-10-CM

## 2024-02-11 DIAGNOSIS — G4733 Obstructive sleep apnea (adult) (pediatric): Secondary | ICD-10-CM | POA: Diagnosis not present
